# Patient Record
Sex: Female | Born: 2005
Health system: Southern US, Community
[De-identification: ages and names within clinical notes are randomized; demographics above are authoritative.]

## PROBLEM LIST (undated history)

## (undated) DIAGNOSIS — F419 Anxiety disorder, unspecified: Secondary | ICD-10-CM

## (undated) DIAGNOSIS — J45909 Unspecified asthma, uncomplicated: Secondary | ICD-10-CM

## (undated) DIAGNOSIS — T7840XA Allergy, unspecified, initial encounter: Secondary | ICD-10-CM

---
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
## 2023-01-01 ENCOUNTER — Other Ambulatory Visit: Payer: BC Managed Care – PPO

## 2023-01-09 ENCOUNTER — Encounter (HOSPITAL_COMMUNITY): Payer: Self-pay | Admitting: Psychiatry

## 2023-01-09 ENCOUNTER — Telehealth (HOSPITAL_BASED_OUTPATIENT_CLINIC_OR_DEPARTMENT_OTHER): Payer: BC Managed Care – PPO | Admitting: Psychiatry

## 2023-01-09 DIAGNOSIS — F411 Generalized anxiety disorder: Secondary | ICD-10-CM | POA: Diagnosis not present

## 2023-01-09 DIAGNOSIS — F325 Major depressive disorder, single episode, in full remission: Secondary | ICD-10-CM

## 2023-01-09 NOTE — Progress Notes (Signed)
BH MD/PA/NP OP Progress Note  01/09/2023 8:12 AM Briana Mccoy  MRN:  [id number]  Visit Diagnosis:    ICD-10-CM   1. GAD (generalized anxiety disorder)  F41.1     2. Major depressive disorder in remission, unspecified whether recurrent (HCC)  F32.5       Assessment: Briana Mccoy is a 17 y.o. female with a history of GAD and MDD who presented to [facility] for initial evaluation on 12/09/2022 after transfer from Dr. Milana Mccoy.    At initial evaluation patient reported symptoms of anxiety including excessive worry, ruminations, restlessness, increased irritability, negative thought patterns, skin/hair picking behaviors, and nausea.  Patient can experience increased physical symptoms when exposed to stressors including phone calls, phobias (bird/foods), or social interactions.  She also endorsed some degree of passive behaviors though denied associated compulsions.  In the past patient's anxiety could progress to depressive symptoms including low mood, anhedonia, amotivation, fatigue, and hypersomnia.  She denied any SI or thoughts of self-harm.  Since starting medications anxiety symptoms have become more tolerable and episodes of depression have become more intermittent and less severe.  Patient met criteria for generalized anxiety disorder with a strong social component as well as MDD in partial remission.  She also had some traits of OCD though not enough to meet criteria.   Briana Mccoy presents for follow-up evaluation. Today, 01/09/23, patient reports that her anxiety had increased towards the end school and has gone up again starting work.  Overall it has been manageable with the Lexapro.  Patient used the Atarax while in school but has not needed it since.  She has also had tried propranolol.  We will continue on her current regimen and follow-up in 4 to 6 weeks.  Plan: - Continue Lexapro 20 mg QD - Continue Atarax 10-30 mg QD prn for anxiety - Start  propranolol 5 mg BID prn for anxiety - Continue therapy with Briana Mccoy - Crisis resources reviewed - Follow up in 4-6 weeks    Chief Complaint:  Chief Complaint  Patient presents with   Follow-up   HPI: Briana Mccoy presents reporting that things are going well.  She finished school which was nice and then was on vacation up until a week and a half ago when she started and camp.  Patient reports that her anxiety was a little bit higher as she finished school but resolved completely while she was on vacation.  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  She notes that cannot because she has to be more outgoing and exuberant which is atypical for her since she is typically more introverted.  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  The staff physician allowed for more routine and structure which helps her anxiety.  Overall Briana Mccoy does feel that the need to be outgoing is a good thing though as is getting her prepare for college.  In regards to medication she continues to take Lexapro every day without adverse side effects.  While at school she was taking hydroxyzine at night to help with sleep.  She did not need the hydroxyzine after finishing school or once again to camp since she is emotionally and physically exhausted by the end of each day.  She has not tried propranolol yet due to some concerns about possible side effects in addition to not needing it while she was on medication.  She will consider trying this weekend if her anxiety is up.  Past Psychiatric History: Patient denies prior psychiatric hospitalizations or past suicide attempts. She connected with Dr. Milana Mccoy in 2021 for psychiatric care and followed with her until she retired. She sees Nurse, mental health for  therapy.   Patient has tried bupropion (dissociates), venlafaxine, fluoxetine (weight loss), and BuSpar.  Patient reports occasional marijuana use.  Risk of marijuana were discussed including adverse effects on anxiety.  She denies any other substance use.  Past Medical History:  Past Medical History:  Diagnosis Date   Allergy    Anxiety    Asthma     Past Surgical History:  Procedure Laterality Date   NASAL SEPTOPLASTY W/ TURBINOPLASTY Bilateral 02/25/2022   Procedure: NASAL SEPTOPLASTY WITH TURBINATE REDUCTION;  Surgeon: Briana Pies, MD;  Location: [facility];  Service: ENT;  Laterality: Bilateral;   WISDOM TOOTH EXTRACTION  2021    Family History: No family history on file.  Social History:  Social History   Socioeconomic History   Marital status: Single    Spouse name: Not on file   Number of children: Not on file   Years of education: Not on file   Highest education level: Not on file  Occupational History   Not on file  Tobacco Use   Smoking status: Never   Smokeless tobacco: Not on file  Vaping Use   Vaping Use: Former   Devices: last use july  Substance and Sexual Activity   Alcohol use: Never   Drug use: Never   Sexual activity: Not on file  Other Topics Concern   Not on file  Social History Narrative   Not on file   Social Determinants of Health   Financial Resource Strain: Not on file  Food Insecurity: Not on file  Transportation Needs: Not on file  Physical Activity: Not on file  Stress: Not on file  Social Connections: Not on file    Allergies:  Allergies  Allergen Reactions   Tree Extract Other (See Comments)    TREE NUTS    Current Medications: Current Outpatient Medications  Medication Sig Dispense Refill   cetirizine (ZYRTEC) 10 MG tablet Take 10 mg by mouth daily.     escitalopram (LEXAPRO) 20 MG tablet Take 1 tablet (20 mg total) by mouth daily. 90 tablet 1   FLOVENT HFA 44 MCG/ACT inhaler Inhale 2 puffs into the  lungs 2 (two) times daily.     hydrOXYzine (ATARAX) 10 MG tablet Take up to 3 tabs (30mg ) each day and 1 tab at bedtime as needed for anxiety 120 tablet 1   propranolol (INDERAL) 10 MG tablet Take 0.5 tablets (5 mg total) by mouth 2 (two) times daily as needed (anxiety). 60 tablet 1   No current facility-administered medications for this visit.     Psychiatric Specialty Exam: Review of Systems  There were no vitals taken for this visit.There is no height or weight on file to calculate BMI.  General Appearance: Fairly Groomed  Eye Contact:  Good  Speech:  Clear and Coherent and Normal Rate  Volume:  Normal  Mood:  Anxious and Euthymic  Affect:  Appropriate and Congruent  Thought Process:  Coherent  Orientation:  Full (Time, Place, and Person)  Thought Content: Logical   Suicidal Thoughts:  No  Homicidal Thoughts:  No  Memory:  Immediate;   Good  Judgement:  Good  Insight:  Good  Psychomotor Activity:  Normal  Concentration:  Concentration: Good  Recall:  Good  Fund of Knowledge: Good  Language: Good  Akathisia:  NA    AIMS (if indicated): not done  Assets:  Communication Skills Desire for Improvement Housing Intimacy Physical Health Resilience Social Support Talents/Skills Transportation Vocational/Educational  ADL's:  Intact  Cognition: WNL  Sleep:  Good   Metabolic Disorder Labs: No results found for: "HGBA1C", "MPG" No results found for: "PROLACTIN" No results found for: "CHOL", "TRIG", "HDL", "CHOLHDL", "VLDL", "LDLCALC" No results found for: "TSH"  Therapeutic Level Labs: No results found for: "LITHIUM" No results found for: "VALPROATE" No results found for: "CBMZ"   Screenings: GAD-7    Flowsheet Row Office Visit from 12/09/2022 [facility]  Total GAD-7 Score 16      PHQ2-9    Flowsheet Row Office Visit from 12/09/2022 in [facility] Counselor from 04/24/2022 in  [facility] Office Visit from 01/10/2021 in [facility] at [facility] Video Visit from 09/08/2020 in [facility] at [facility]  PHQ-2 Total Score 1 0 4 5  PHQ-9 Total Score -- -- 13 18      Flowsheet Row Office Visit from 12/09/2022 in [facility] Counselor from 04/24/2022 in [facility] at [facility] Admission (Discharged) from 02/25/2022 in [facility]  C-SSRS RISK CATEGORY No Risk No Risk No Risk       Collaboration of Care: Collaboration of Care: Medication Management AEB medication continuation  Patient/Guardian was advised Release of Information must be obtained prior to any record release in order to collaborate their care with an outside provider. Patient/Guardian was advised if they have not already done so to contact the registration department to sign all necessary forms in order for [place] to release information regarding their care.   Consent: Patient/Guardian gives verbal consent for treatment and assignment of benefits for services provided during this visit. Patient/Guardian expressed understanding and agreed to proceed.    Stasia Cavalier, MD 01/09/2023, 8:12 AM   Virtual Visit via Video Note  I connected with Briana Mccoy on 01/09/23 at  8:00 AM EDT by a video enabled telemedicine application and verified that I am speaking with the correct person using two identifiers.  Location: Patient: Home Provider: Home Office   I discussed the limitations of evaluation and management by telemedicine and the availability of in person appointments. The patient expressed understanding and agreed to proceed.   I discussed the assessment and treatment plan with the patient. The patient was provided an opportunity to ask questions and all were answered. The patient agreed with  the plan and demonstrated an understanding of the instructions.   The patient was advised to call back or seek an in-person evaluation if the symptoms worsen or if the condition fails to improve as anticipated.  30 minutes were spent in chart review, interview, psycho education, counseling, medical decision making, coordination of care and long-term prognosis.  Patient was given opportunity to ask question and all concerns and questions were addressed and answers. Excluding separately billable services. Stasia Cavalier, MD

## 2023-02-12 DIAGNOSIS — R112 Nausea with vomiting, unspecified: Secondary | ICD-10-CM | POA: Diagnosis not present

## 2023-02-12 DIAGNOSIS — Z20822 Contact with and (suspected) exposure to covid-19: Secondary | ICD-10-CM | POA: Diagnosis not present

## 2023-02-12 DIAGNOSIS — J029 Acute pharyngitis, unspecified: Secondary | ICD-10-CM | POA: Diagnosis not present

## 2023-02-12 DIAGNOSIS — J069 Acute upper respiratory infection, unspecified: Secondary | ICD-10-CM | POA: Diagnosis not present

## 2023-02-17 DIAGNOSIS — J45909 Unspecified asthma, uncomplicated: Secondary | ICD-10-CM | POA: Diagnosis not present

## 2023-02-17 DIAGNOSIS — R062 Wheezing: Secondary | ICD-10-CM | POA: Diagnosis not present

## 2023-02-19 NOTE — Progress Notes (Unsigned)
BH MD/PA/NP OP Progress Note  02/20/2023 8:26 AM Twylia Oka  MRN:  [id number]  Visit Diagnosis:    ICD-10-CM   1. GAD (generalized anxiety disorder)  F41.1     2. Major depressive disorder in remission, unspecified whether recurrent (HCC)  F32.5        Assessment: Ane Conerly is a 17 y.o. female with a history of GAD and MDD who presented to [facility] at [facility] for initial evaluation on 12/09/2022 after transfer from Dr. Milana Kidney.    At initial evaluation patient reported symptoms of anxiety including excessive worry, ruminations, restlessness, increased irritability, negative thought patterns, skin/hair picking behaviors, and nausea.  Patient can experience increased physical symptoms when exposed to stressors including phone calls, phobias (bird/foods), or social interactions.  She also endorsed some degree of passive behaviors though denied associated compulsions.  In the past patient's anxiety could progress to depressive symptoms including low mood, anhedonia, amotivation, fatigue, and hypersomnia.  She denied any SI or thoughts of self-harm.  Since starting medications anxiety symptoms have become more tolerable and episodes of depression have become more intermittent and less severe.  Patient met criteria for generalized anxiety disorder with a strong social component as well as MDD in partial remission.  She also had some traits of OCD though not enough to meet criteria.   Francena Hanly Olivares presents for follow-up evaluation. Today, 02/20/23, patient reports that her anxiety remains present at consistent mild level that is manageable.  In addition to this she has had a couple panic attacks over month.  Patient has not tried propranolol due to misplacing the medication but does take the Atarax with good success.  However is over sedating and she is interested in trying propranolol when checked about the refill from the pharmacy.  The panic attacks are secondary to  interpersonal stressors and feelings of being overwhelmed.  It was suggested that patient may connect with a therapist and she plans to reach out for another appointment.  We will follow-up in 6 to 8 weeks  Plan: - Continue Lexapro 20 mg QD - Continue Atarax 10-30 mg QD prn for anxiety - Continue propranolol 5 mg BID prn for anxiety - Continue therapy with [name], recommend scheduling another session - Crisis resources reviewed - Follow up in 6-8 weeks    Chief Complaint:  Chief Complaint  Patient presents with   Follow-up   HPI: Morning presents reporting the past month has been fine. She does not feel great currently though as she has been sick. She has still been working during this time to which has been a bit overwhelming.  Due to this she has been considering leaving camp for a week or early to rest at home before she starts college in August.  Patient plans to make her decision by the end of the day.  Over the past 6 weeks her anxiety has been fairly well-controlled.  She reports having some mild baseline anxiety in addition to 2 episodes of major anxiety twice the past month.  One time was when she was feeling more overwhelmed at work and the other had to do with stress about her relationship.  As her relationship is only around 4 to 45 months old the distance being several hours from each other and not having seen each other for a couple months has been very hard.  Tawsha notes that it feels like something that is his way and her down is worried that it will be the same  when she goes to college.  Patient as she is trying to discuss that with her boyfriend however he disagrees with her point of view.  We suggested that Neelah reconnect with a therapist to try and work through this which she agreed would be helpful.  Regards to medication she has taken Lexapro consistently and denies any adverse side effects.  Patient had wanted to take the propranolol for her panic attacks however  has misplaced the medication and been unable to find it.  Patient does note he plans to pick up a refill from the pharmacy instead.  While she has been using hydroxyzine with some benefit the medication makes her over sedated and she had difficulty doing anything the rest of the day.  She also did discuss her concern about the interaction with her asthma.  We did review this and patient notes that her asthma is not severe along really flaring up during times that she is sick.  Based off this and the limited frequency she is planning on using the propranolol we are not overly concerned about it negatively impacting her asthma symptoms.  She will monitor this however when she tries medication.  Past Psychiatric History: Patient denies prior psychiatric hospitalizations or past suicide attempts. She connected with Dr. Milana Kidney in 2021 for psychiatric care and followed with her until she retired. She sees Nurse, mental health for therapy.   Patient has tried bupropion (dissociates), venlafaxine, fluoxetine (weight loss), and BuSpar.  Patient reports occasional marijuana use.  Risk of marijuana were discussed including adverse effects on anxiety.  She denies any other substance use.  Past Medical History:  Past Medical History:  Diagnosis Date   Allergy    Anxiety    Asthma     Past Surgical History:  Procedure Laterality Date   NASAL SEPTOPLASTY W/ TURBINOPLASTY Bilateral 02/25/2022   Procedure: NASAL SEPTOPLASTY WITH TURBINATE REDUCTION;  Surgeon: Newman Pies, MD;  Location: [facility];  Service: ENT;  Laterality: Bilateral;   WISDOM TOOTH EXTRACTION  2021    Family History: No family history on file.  Social History:  Social History   Socioeconomic History   Marital status: Single    Spouse name: Not on file   Number of children: Not on file   Years of education: Not on file   Highest education level: Not on file  Occupational History   Not on file  Tobacco Use   Smoking status:  Never   Smokeless tobacco: Not on file  Vaping Use   Vaping status: Former   Devices: last use july  Substance and Sexual Activity   Alcohol use: Never   Drug use: Never   Sexual activity: Not on file  Other Topics Concern   Not on file  Social History Narrative   Not on file   Social Determinants of Health   Financial Resource Strain: Not on file  Food Insecurity: Not on file  Transportation Needs: Not on file  Physical Activity: Not on file  Stress: Not on file  Social Connections: Not on file    Allergies:  Allergies  Allergen Reactions   Tree Extract Other (See Comments)    TREE NUTS    Current Medications: Current Outpatient Medications  Medication Sig Dispense Refill   cetirizine (ZYRTEC) 10 MG tablet Take 10 mg by mouth daily.     escitalopram (LEXAPRO) 20 MG tablet Take 1 tablet (20 mg total) by mouth daily. 90 tablet 1   FLOVENT HFA 44 MCG/ACT inhaler  Inhale 2 puffs into the lungs 2 (two) times daily.     hydrOXYzine (ATARAX) 10 MG tablet Take up to 3 tabs (30mg ) each day and 1 tab at bedtime as needed for anxiety 120 tablet 1   propranolol (INDERAL) 10 MG tablet Take 0.5 tablets (5 mg total) by mouth 2 (two) times daily as needed (anxiety). 60 tablet 1   No current facility-administered medications for this visit.     Psychiatric Specialty Exam: Review of Systems  There were no vitals taken for this visit.There is no height or weight on file to calculate BMI.  General Appearance: Fairly Groomed  Eye Contact:  Good  Speech:  Clear and Coherent and Normal Rate  Volume:  Normal  Mood:  Anxious and tired  Affect:  Appropriate and Congruent  Thought Process:  Coherent  Orientation:  Full (Time, Place, and Person)  Thought Content: Logical   Suicidal Thoughts:  No  Homicidal Thoughts:  No  Memory:  Immediate;   Good  Judgement:  Fair  Insight:  Fair  Psychomotor Activity:  Normal  Concentration:  Concentration: Good  Recall:  Good  Fund of  Knowledge: Good  Language: Good  Akathisia:  NA    AIMS (if indicated): not done  Assets:  Communication Skills Desire for Improvement Housing Intimacy Physical Health Resilience Social Support Talents/Skills Transportation Vocational/Educational  ADL's:  Intact  Cognition: WNL  Sleep:  Good   Metabolic Disorder Labs: No results found for: "HGBA1C", "MPG" No results found for: "PROLACTIN" No results found for: "CHOL", "TRIG", "HDL", "CHOLHDL", "VLDL", "LDLCALC" No results found for: "TSH"  Therapeutic Level Labs: No results found for: "LITHIUM" No results found for: "VALPROATE" No results found for: "CBMZ"   Screenings: GAD-7    Flowsheet Row [facility]  Total GAD-7 Score 16      PHQ2-9    Flowsheet Row Office Visit from 12/09/2022 in [facility] Counselor from 04/24/2022 in [place] [facility] at [facility] Office Visit from 01/10/2021 in [facility] at [facility] Video Visit from 09/08/2020 in [facility] at [facility]  PHQ-2 Total Score 1 0 4 5  PHQ-9 Total Score -- -- 13 18      Flowsheet Row Office Visit from 12/09/2022 in [facility] Counselor from 04/24/2022 in [place] [facility] at [facility] Admission (Discharged) from 02/25/2022 in [facility]  C-SSRS RISK CATEGORY No Risk No Risk No Risk       Collaboration of Care: Collaboration of Care: Medication Management AEB medication continuation  Patient/Guardian was advised Release of Information must be obtained prior to any record release in order to collaborate their care with an outside provider. Patient/Guardian was advised if they have not already done so to contact the registration department to sign all necessary  forms in order for [place] to release information regarding their care.   Consent: Patient/Guardian gives verbal consent for treatment and assignment of benefits for services provided during this visit. Patient/Guardian expressed understanding and agreed to proceed.    Stasia Cavalier, MD 02/20/2023, 8:26 AM   Virtual Visit via Video Note  I connected with Bradd Canary on 02/20/23 at  8:00 AM EDT by a video enabled telemedicine application and verified that I am speaking with the correct person using two identifiers.  Location: Patient: Home Provider: Home Office   I discussed the limitations of  evaluation and management by telemedicine and the availability of in person appointments. The patient expressed understanding and agreed to proceed.   I discussed the assessment and treatment plan with the patient. The patient was provided an opportunity to ask questions and all were answered. The patient agreed with the plan and demonstrated an understanding of the instructions.   The patient was advised to call back or seek an in-person evaluation if the symptoms worsen or if the condition fails to improve as anticipated.  30 minutes were spent in chart review, interview, psycho education, counseling, medical decision making, coordination of care and long-term prognosis.  Patient was given opportunity to ask question and all concerns and questions were addressed and answers. Excluding separately billable services. Stasia Cavalier, MD

## 2023-02-20 ENCOUNTER — Telehealth (HOSPITAL_COMMUNITY): Payer: BC Managed Care – PPO | Admitting: Psychiatry

## 2023-02-20 ENCOUNTER — Encounter (HOSPITAL_COMMUNITY): Payer: Self-pay | Admitting: Psychiatry

## 2023-02-20 DIAGNOSIS — F411 Generalized anxiety disorder: Secondary | ICD-10-CM

## 2023-02-20 DIAGNOSIS — F325 Major depressive disorder, single episode, in full remission: Secondary | ICD-10-CM | POA: Diagnosis not present

## 2023-03-04 ENCOUNTER — Other Ambulatory Visit: Payer: Self-pay | Admitting: Obstetrics and Gynecology

## 2023-03-04 ENCOUNTER — Ambulatory Visit
Admission: RE | Admit: 2023-03-04 | Discharge: 2023-03-04 | Disposition: A | Discharge status: Home / Self Care | Payer: BC Managed Care – PPO | Source: Ambulatory Visit | Attending: Obstetrics and Gynecology | Admitting: Obstetrics and Gynecology

## 2023-03-04 DIAGNOSIS — N6315 Unspecified lump in the right breast, overlapping quadrants: Secondary | ICD-10-CM | POA: Diagnosis not present

## 2023-03-04 DIAGNOSIS — N6310 Unspecified lump in the right breast, unspecified quadrant: Secondary | ICD-10-CM

## 2023-03-04 DIAGNOSIS — N6313 Unspecified lump in the right breast, lower outer quadrant: Secondary | ICD-10-CM | POA: Diagnosis not present

## 2023-03-04 DIAGNOSIS — Z01419 Encounter for gynecological examination (general) (routine) without abnormal findings: Secondary | ICD-10-CM | POA: Diagnosis not present

## 2023-03-04 DIAGNOSIS — Z118 Encounter for screening for other infectious and parasitic diseases: Secondary | ICD-10-CM | POA: Diagnosis not present

## 2023-03-06 ENCOUNTER — Ambulatory Visit
Admission: RE | Admit: 2023-03-06 | Discharge: 2023-03-06 | Disposition: A | Discharge status: Home / Self Care | Payer: BC Managed Care – PPO | Source: Ambulatory Visit | Attending: Obstetrics and Gynecology | Admitting: Obstetrics and Gynecology

## 2023-03-06 DIAGNOSIS — N6310 Unspecified lump in the right breast, unspecified quadrant: Secondary | ICD-10-CM

## 2023-03-06 DIAGNOSIS — N6315 Unspecified lump in the right breast, overlapping quadrants: Secondary | ICD-10-CM | POA: Diagnosis not present

## 2023-03-06 DIAGNOSIS — D241 Benign neoplasm of right breast: Secondary | ICD-10-CM | POA: Diagnosis not present

## 2023-03-06 DIAGNOSIS — N6313 Unspecified lump in the right breast, lower outer quadrant: Secondary | ICD-10-CM | POA: Diagnosis not present

## 2023-03-06 HISTORY — PX: BREAST BIOPSY: SHX20

## 2023-04-06 IMAGING — DX DG WRIST COMPLETE 3+V*R*
4 series · 4 of 4 positions shown · non-contrast
Comparison: None Available.

CLINICAL DATA: Fall.

EXAM:
RIGHT WRIST - COMPLETE 3+ VIEW

[wrist ap]
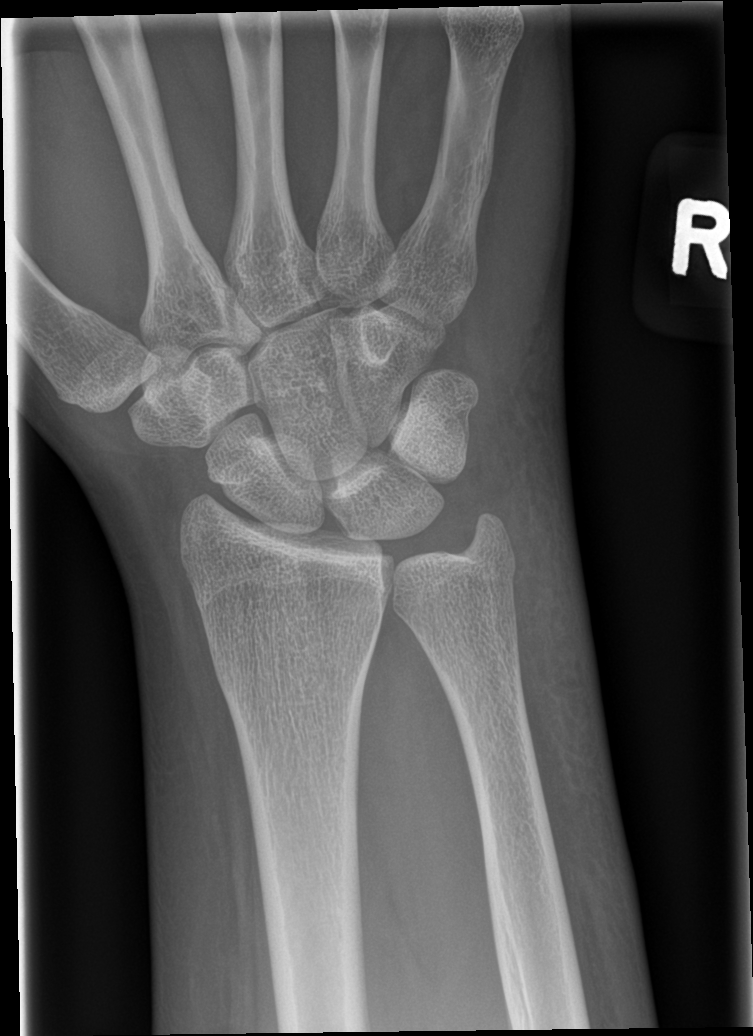

[wrist obl]
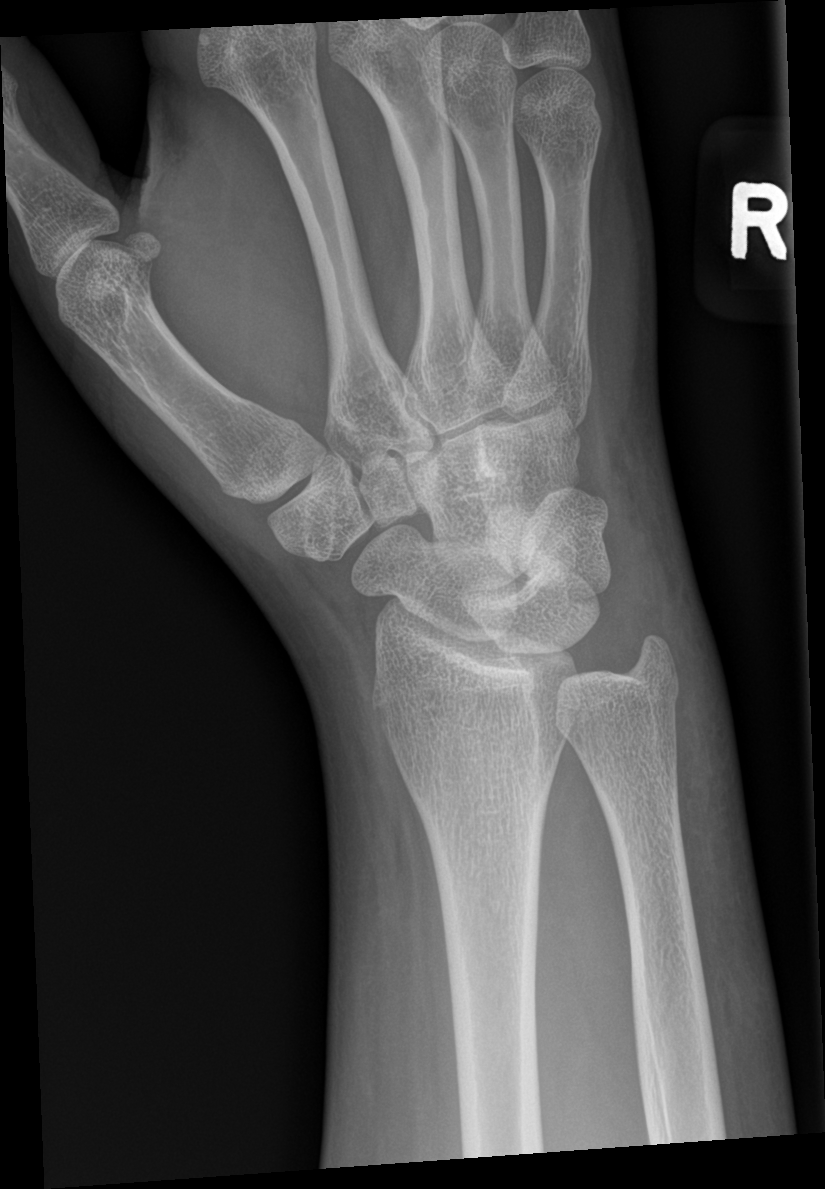

[wrist lat]
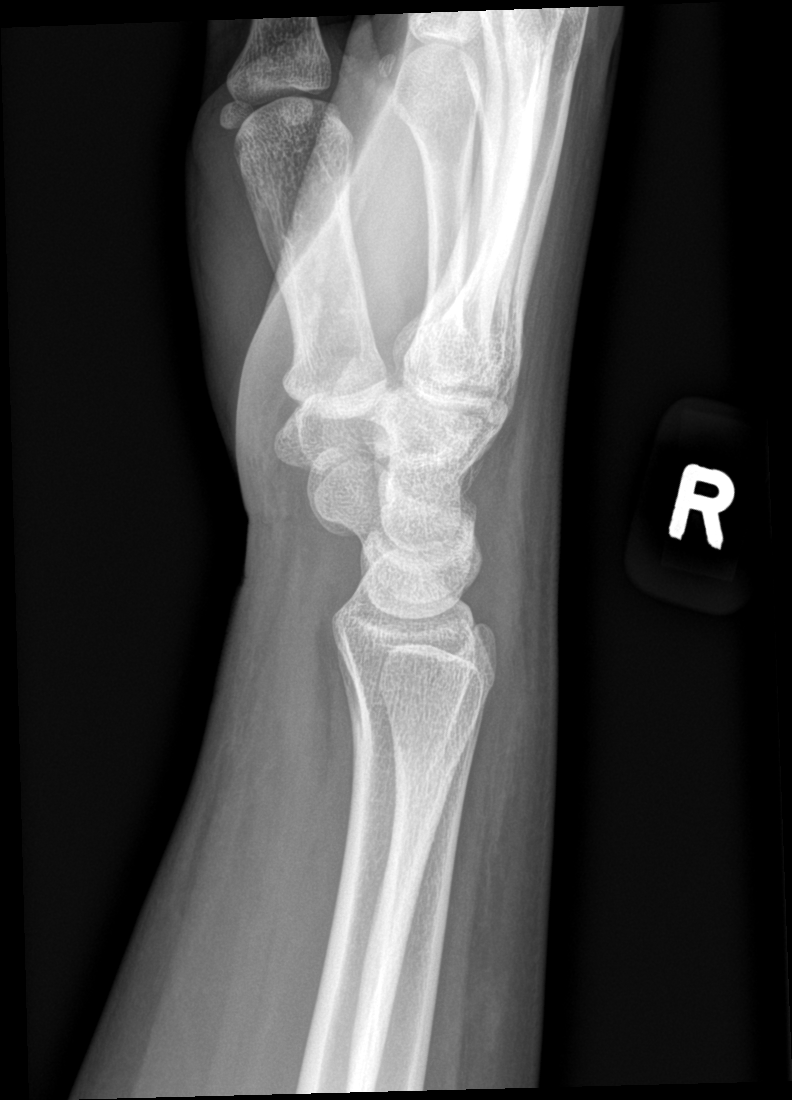

[wrist navicular]
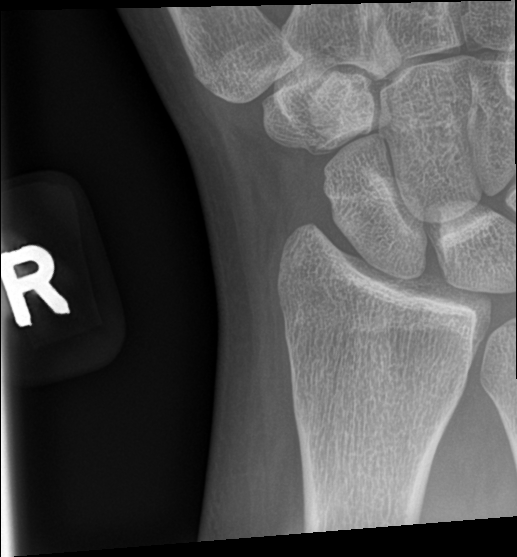

[4 of 4 positions shown; findings below may reference images not displayed]

FINDINGS: There is no evidence of fracture or dislocation. There is no
evidence of arthropathy or other focal bone abnormality. There is
medial soft tissue swelling.
IMPRESSION: No acute fracture or dislocation.

## 2023-04-14 NOTE — Progress Notes (Deleted)
BH MD/PA/NP OP Progress Note  04/14/2023 2:16 PM Briana Mccoy  MRN:  [id number]  Visit Diagnosis:  No diagnosis found.    Assessment: Briana Mccoy is a 17 y.o. female with a history of GAD and MDD who presented to [facility] at [facility] for initial evaluation on 12/09/2022 after transfer from Dr. Milana Mccoy.    At initial evaluation patient reported symptoms of anxiety including excessive worry, ruminations, restlessness, increased irritability, negative thought patterns, skin/hair picking behaviors, and nausea.  Patient can experience increased physical symptoms when exposed to stressors including phone calls, phobias (bird/foods), or social interactions.  She also endorsed some degree of passive behaviors though denied associated compulsions.  In the past patient's anxiety could progress to depressive symptoms including low mood, anhedonia, amotivation, fatigue, and hypersomnia.  She denied any SI or thoughts of self-harm.  Since starting medications anxiety symptoms have become more tolerable and episodes of depression have become more intermittent and less severe.  Patient met criteria for generalized anxiety disorder with a strong social component as well as MDD in partial remission.  She also had some traits of OCD though not enough to meet criteria.  Briana Mccoy presents for follow-up evaluation. Today, 04/14/23, patient reports that her   anxiety remains present at consistent mild level that is manageable.  In addition to this she has had a couple panic attacks over month.  Patient has not tried propranolol due to misplacing the medication but does take the Atarax with good success.  However is over sedating and she is interested in trying propranolol when checked about the refill from the pharmacy.  The panic attacks are secondary to interpersonal stressors and feelings of being overwhelmed.  It was suggested that patient may connect with a therapist and she plans  to reach out for another appointment.  We will follow-up in 6 to 8 weeks  Plan: - Continue Lexapro 20 mg QD - Continue Atarax 10-30 mg QD prn for anxiety - Continue propranolol 5 mg BID prn for anxiety - Continue therapy with Briana Mccoy, recommend scheduling another session - Crisis resources reviewed - Follow up in 6-8 weeks    Chief Complaint:  No chief complaint on file.  HPI: Briana Mccoy presents reporting that the  past month has been fine. She does not feel great currently though as she has been sick. She has still been working during this time to which has been a bit overwhelming.  Due to this she has been considering leaving camp for a week or early to rest at home before she starts college in August.  Patient plans to make her decision by the end of the day.  Over the past 6 weeks her anxiety has been fairly well-controlled.  She reports having some mild baseline anxiety in addition to 2 episodes of major anxiety twice the past month.  One time was when she was feeling more overwhelmed at work and the other had to do with stress about her relationship.  As her relationship is only around 4 to 67 months old the distance being several hours from each other and not having seen each other for a couple months has been very hard.  Briana Mccoy notes that it feels like something that is his way and her down is worried that it will be the same when she goes to college.  Patient as she is trying to discuss that with her boyfriend however he disagrees with her point of view.  We suggested that Briana Mccoy reconnect  with a therapist to try and work through this which she agreed would be helpful.  Regards to medication she has taken Lexapro consistently and denies any adverse side effects.  Patient had wanted to take the propranolol for her panic attacks however has misplaced the medication and been unable to find it.  Patient does note he plans to pick up a refill from the pharmacy instead.  While she has been  using hydroxyzine with some benefit the medication makes her over sedated and she had difficulty doing anything the rest of the day.  She also did discuss her concern about the interaction with her asthma.  We did review this and patient notes that her asthma is not severe along really flaring up during times that she is sick.  Based off this and the limited frequency she is planning on using the propranolol we are not overly concerned about it negatively impacting her asthma symptoms.  She will monitor this however when she tries medication.  Past Psychiatric History: Patient denies prior psychiatric hospitalizations or past suicide attempts. She connected with Dr. Milana Mccoy in 2021 for psychiatric care and followed with her until she retired. She sees Nurse, mental health for therapy.   Patient has tried bupropion (dissociates), venlafaxine, fluoxetine (weight loss), and BuSpar.  Patient reports occasional marijuana use.  Risk of marijuana were discussed including adverse effects on anxiety.  She denies any other substance use.  Past Medical History:  Past Medical History:  Diagnosis Date   Allergy    Anxiety    Asthma     Past Surgical History:  Procedure Laterality Date   BREAST BIOPSY Right 03/06/2023   [place] RT BREAST BX W LOC DEV EA ADD LESION IMG BX SPEC US GUIDE 03/06/2023 [facility]   BREAST BIOPSY Right 03/06/2023   [place] RT BREAST BX W LOC DEV 1ST LESION IMG BX SPEC US GUIDE 03/06/2023 [facility]   NASAL SEPTOPLASTY W/ TURBINOPLASTY Bilateral 02/25/2022   Procedure: NASAL SEPTOPLASTY WITH TURBINATE REDUCTION;  Surgeon: Briana Pies, MD;  Location: [facility];  Service: ENT;  Laterality: Bilateral;   WISDOM TOOTH EXTRACTION  2021    Family History: No family history on file.  Social History:  Social History   Socioeconomic History   Marital status: Single    Spouse name: Not on file   Number of children: Not on file   Years of education: Not on file   Highest  education level: Not on file  Occupational History   Not on file  Tobacco Use   Smoking status: Never   Smokeless tobacco: Not on file  Vaping Use   Vaping status: Former   Devices: last use july  Substance and Sexual Activity   Alcohol use: Never   Drug use: Never   Sexual activity: Not on file  Other Topics Concern   Not on file  Social History Narrative   Not on file   Social Determinants of Health   Financial Resource Strain: Not on file  Food Insecurity: Not on file  Transportation Needs: Not on file  Physical Activity: Not on file  Stress: Not on file  Social Connections: Not on file    Allergies:  Allergies  Allergen Reactions   Tree Extract Other (See Comments)    TREE NUTS    Current Medications: Current Outpatient Medications  Medication Sig Dispense Refill   cetirizine (ZYRTEC) 10 MG tablet Take 10 mg by mouth daily.     escitalopram (LEXAPRO) 20 MG  tablet Take 1 tablet (20 mg total) by mouth daily. 90 tablet 1   FLOVENT HFA 44 MCG/ACT inhaler Inhale 2 puffs into the lungs 2 (two) times daily.     hydrOXYzine (ATARAX) 10 MG tablet Take up to 3 tabs (30mg ) each day and 1 tab at bedtime as needed for anxiety 120 tablet 1   propranolol (INDERAL) 10 MG tablet Take 0.5 tablets (5 mg total) by mouth 2 (two) times daily as needed (anxiety). 60 tablet 1   No current facility-administered medications for this visit.     Psychiatric Specialty Exam: Review of Systems  There were no vitals taken for this visit.There is no height or weight on file to calculate BMI.  General Appearance: Fairly Groomed  Eye Contact:  Good  Speech:  Clear and Coherent and Normal Rate  Volume:  Normal  Mood:  Anxious and tired  Affect:  Appropriate and Congruent  Thought Process:  Coherent  Orientation:  Full (Time, Place, and Person)  Thought Content: Logical   Suicidal Thoughts:  No  Homicidal Thoughts:  No  Memory:  Immediate;   Good  Judgement:  Fair  Insight:  Fair   Psychomotor Activity:  Normal  Concentration:  Concentration: Good  Recall:  Good  Fund of Knowledge: Good  Language: Good  Akathisia:  NA    AIMS (if indicated): not done  Assets:  Communication Skills Desire for Improvement Housing Intimacy Physical Health Resilience Social Support Talents/Skills Transportation Vocational/Educational  ADL's:  Intact  Cognition: WNL  Sleep:  Good   Metabolic Disorder Labs: No results found for: "HGBA1C", "MPG" No results found for: "PROLACTIN" No results found for: "CHOL", "TRIG", "HDL", "CHOLHDL", "VLDL", "LDLCALC" No results found for: "TSH"  Therapeutic Level Labs: No results found for: "LITHIUM" No results found for: "VALPROATE" No results found for: "CBMZ"   Screenings: GAD-7    Flowsheet Row [facility]/20[facility]  Total GAD-7 Score 16      PHQ2-9    Flowsheet Row Office Visit from 12/09/2022 in [facility] Counselor from 04/24/2022 in [facility] at [facility] Office Visit from 01/10/2021 in [facility] at [facility] Video Visit from 09/08/2020 in [facility] at [facility]  PHQ-2 Total Score 1 0 4 5  PHQ-9 Total Score -- -- 13 18      Flowsheet Row Office Visit from 12/09/2022 in [facility] Counselor from 04/24/2022 in [facility] at [facility] Admission (Discharged) from 02/25/2022 in [facility]  C-SSRS RISK CATEGORY No Risk No Risk No Risk       Collaboration of Care: Collaboration of Care: Medication Management AEB medication continuation  Patient/Guardian was advised Release of Information must be obtained prior to any record release in order to collaborate their care with an outside provider. Patient/Guardian was  advised if they have not already done so to contact the registration department to sign all necessary forms in order for [place] to release information regarding their care.   Consent: Patient/Guardian gives verbal consent for treatment and assignment of benefits for services provided during this visit. Patient/Guardian expressed understanding and agreed to proceed.    Stasia Cavalier, MD 04/14/2023, 2:16 PM   Virtual Visit via Video Note  I connected with Briana Mccoy on 04/14/23 at  8:00 AM EDT by a video enabled telemedicine application and verified that I am speaking with  the correct person using two identifiers.  Location: Patient: Home Provider: Home Office   I discussed the limitations of evaluation and management by telemedicine and the availability of in person appointments. The patient expressed understanding and agreed to proceed.   I discussed the assessment and treatment plan with the patient. The patient was provided an opportunity to ask questions and all were answered. The patient agreed with the plan and demonstrated an understanding of the instructions.   The patient was advised to call back or seek an in-person evaluation if the symptoms worsen or if the condition fails to improve as anticipated.  30 minutes were spent in chart review, interview, psycho education, counseling, medical decision making, coordination of care and long-term prognosis.  Patient was given opportunity to ask question and all concerns and questions were addressed and answers. Excluding separately billable services. Stasia Cavalier, MD

## 2023-04-18 ENCOUNTER — Encounter (HOSPITAL_COMMUNITY): Payer: Self-pay

## 2023-04-18 ENCOUNTER — Encounter (HOSPITAL_COMMUNITY): Payer: BC Managed Care – PPO | Admitting: Psychiatry

## 2023-04-18 NOTE — Progress Notes (Signed)
Patient did not show up for the appointment. Attempted to call the patient but received no response. Left a voicemail for patient to reschedule.   This encounter was created in error - please disregard.

## 2023-04-24 ENCOUNTER — Telehealth (HOSPITAL_COMMUNITY): Payer: Self-pay | Admitting: Licensed Clinical Social Worker

## 2023-04-24 NOTE — Telephone Encounter (Signed)
Patient is now in college in [place]. Patient would like to continue services with you, however will need virtual.   Will you be ok with that?  She will come in person during her breaks if she is able.   Best call back number. [phone]

## 2023-05-28 DIAGNOSIS — Z23 Encounter for immunization: Secondary | ICD-10-CM | POA: Diagnosis not present

## 2023-05-28 DIAGNOSIS — J029 Acute pharyngitis, unspecified: Secondary | ICD-10-CM | POA: Diagnosis not present

## 2023-06-30 ENCOUNTER — Telehealth (HOSPITAL_COMMUNITY): Payer: Self-pay

## 2023-06-30 DIAGNOSIS — F411 Generalized anxiety disorder: Secondary | ICD-10-CM

## 2023-06-30 MED ORDER — HYDROXYZINE HCL 10 MG PO TABS
ORAL_TABLET | ORAL | 0 refills | Status: DC
Start: 2023-06-30 — End: 2023-11-27

## 2023-06-30 MED ORDER — PROPRANOLOL HCL 10 MG PO TABS
5.0000 mg | ORAL_TABLET | Freq: Two times a day (BID) | ORAL | 0 refills | Status: DC | PRN
Start: 2023-06-30 — End: 2023-08-28

## 2023-06-30 MED ORDER — ESCITALOPRAM OXALATE 20 MG PO TABS
20.0000 mg | ORAL_TABLET | Freq: Every day | ORAL | 0 refills | Status: DC
Start: 2023-06-30 — End: 2023-08-28

## 2023-06-30 NOTE — Telephone Encounter (Signed)
Patient missed her appointment in September, she has an appointment for January and wants to know if she can get refills until then. Please review and advise, thank you

## 2023-06-30 NOTE — Telephone Encounter (Signed)
Refills were sent in to [facility]

## 2023-07-29 DIAGNOSIS — S42294A Other nondisplaced fracture of upper end of right humerus, initial encounter for closed fracture: Secondary | ICD-10-CM | POA: Diagnosis not present

## 2023-07-29 DIAGNOSIS — Y9239 Other specified sports and athletic area as the place of occurrence of the external cause: Secondary | ICD-10-CM | POA: Diagnosis not present

## 2023-08-01 ENCOUNTER — Ambulatory Visit (INDEPENDENT_AMBULATORY_CARE_PROVIDER_SITE_OTHER): Payer: BC Managed Care – PPO | Admitting: Physician Assistant

## 2023-08-01 ENCOUNTER — Encounter: Payer: Self-pay | Admitting: Physician Assistant

## 2023-08-01 DIAGNOSIS — M25511 Pain in right shoulder: Secondary | ICD-10-CM | POA: Diagnosis not present

## 2023-08-01 MED ORDER — MELOXICAM 15 MG PO TABS: 15.0000 mg | ORAL_TABLET | Freq: Every day | ORAL | 0 refills | Status: AC

## 2023-08-01 NOTE — Addendum Note (Signed)
 Addended by: Polly Cobia on: 08/01/2023 09:23 AM   Modules accepted: Orders

## 2023-08-01 NOTE — Progress Notes (Signed)
 Office Visit Note   Patient: Briana Mccoy           Date of Birth: 2006/07/24           MRN: [id number] Visit Date: 08/01/2023              Requested by: Madalyn Nest, MD [id number] [place],  [place] [address] PCP: Madalyn Nest, MD   Assessment & Plan: Visit Diagnoses:  1. 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  She is right-hand dominant.  She was seen and evaluated emergency room x-rays were taken demonstrating [facility] separation.  Possibly small chip avulsion.  She is very painful difficult to examine today.  She has no ecchymosis she is able to actively abduct her arm.  She has good grip strength pulses are intact.  I recommend immobilization continue in the sling she may came out to stretch her arm and do gentle pendulum exercises.  Would need to be evaluated in 2 weeks.  She is going back to [place] to go to school I recommended evaluation by practitioner up there to make sure she is progressing in the right direction.  Should take Mobic  which I will call in for her today cannot take ibuprofen in addition to this  Follow-Up Instructions: 2 weeks  Orders:  No orders of the defined types were placed in this encounter.  No orders of the defined types were placed in this encounter.     Procedures: No procedures performed   Clinical Data: No additional findings.   Subjective: No chief complaint on file.   HPI Right-hand-dominant 18 year old college student was skiing in [place]  3 days ago and fell onto her right shoulder denies any dislocation.  Denies any paresthesias.  Was seen and evaluated at a local emergency room there x-rays were taken demonstrating an [facility] separation.  He was given a sling told to follow-up Review of Systems  All other systems reviewed and are negative.   Objective: Vital Signs: There were no vitals taken for this visit.  Physical  Exam Constitutional:      Appearance: Normal appearance.  Skin:    General: Skin is warm and dry.  Neurological:     General: No focal deficit present.     Mental Status: She is alert and oriented to person, place, and time.  Psychiatric:        Mood and Affect: Mood normal.        Behavior: Behavior normal.   Ortho Exam Right arm she has no ecchymosis she has no swelling distal pulses are intact she has good grip strength she has good flexion extension of her elbow.  She is tender over the [employer] joint minimally tender over the humeral head.  Has good active abduction.  Is resistant to other range of motion secondary to pain.  No tenderness to palpation of the humerus Specialty Comments:  No specialty comments available.  Imaging: No results found.   PMFS History: Patient Active Problem List   Diagnosis Date Noted   Pain in right shoulder 08/01/2023   Sebaceous cyst 04/17/2021   Changing pigmented skin lesion 04/17/2021   Past Medical History:  Diagnosis Date   Allergy    Anxiety    Asthma     No family history on file.  Past Surgical History:  Procedure Laterality Date   BREAST BIOPSY Right 03/06/2023  US  RT BREAST BX W LOC DEV EA ADD LESION IMG BX SPEC US  GUIDE 03/06/2023 [facility]   BREAST BIOPSY Right 03/06/2023   US  RT BREAST BX W LOC DEV 1ST LESION IMG BX SPEC US  GUIDE 03/06/2023 [facility]   NASAL SEPTOPLASTY W/ TURBINOPLASTY Bilateral 02/25/2022   Procedure: NASAL SEPTOPLASTY WITH TURBINATE REDUCTION;  Surgeon: Karis Clunes, MD;  Location: [facility];  Service: ENT;  Laterality: Bilateral;   WISDOM TOOTH EXTRACTION  2021   Social History   Occupational History   Not on file  Tobacco Use   Smoking status: Never   Smokeless tobacco: Not on file  Vaping Use   Vaping status: Former   Devices: last use july  Substance and Sexual Activity   Alcohol use: Never   Drug use: Never   Sexual activity: Not on file

## 2023-08-07 DIAGNOSIS — Z30430 Encounter for insertion of intrauterine contraceptive device: Secondary | ICD-10-CM | POA: Diagnosis not present

## 2023-08-07 DIAGNOSIS — J3081 Allergic rhinitis due to animal (cat) (dog) hair and dander: Secondary | ICD-10-CM | POA: Diagnosis not present

## 2023-08-07 DIAGNOSIS — Z3202 Encounter for pregnancy test, result negative: Secondary | ICD-10-CM | POA: Diagnosis not present

## 2023-08-07 DIAGNOSIS — J4530 Mild persistent asthma, uncomplicated: Secondary | ICD-10-CM | POA: Diagnosis not present

## 2023-08-07 DIAGNOSIS — J3089 Other allergic rhinitis: Secondary | ICD-10-CM | POA: Diagnosis not present

## 2023-08-07 DIAGNOSIS — Z309 Encounter for contraceptive management, unspecified: Secondary | ICD-10-CM | POA: Diagnosis not present

## 2023-08-07 DIAGNOSIS — J301 Allergic rhinitis due to pollen: Secondary | ICD-10-CM | POA: Diagnosis not present

## 2023-08-19 DIAGNOSIS — S4351XA Sprain of right acromioclavicular joint, initial encounter: Secondary | ICD-10-CM | POA: Diagnosis not present

## 2023-08-28 ENCOUNTER — Telehealth (HOSPITAL_COMMUNITY): Payer: BC Managed Care – PPO | Admitting: Psychiatry

## 2023-08-28 ENCOUNTER — Telehealth (HOSPITAL_COMMUNITY): Payer: Self-pay | Admitting: Psychiatry

## 2023-08-28 ENCOUNTER — Encounter (HOSPITAL_COMMUNITY): Payer: Self-pay | Admitting: Psychiatry

## 2023-08-28 DIAGNOSIS — F325 Major depressive disorder, single episode, in full remission: Secondary | ICD-10-CM | POA: Diagnosis not present

## 2023-08-28 DIAGNOSIS — F411 Generalized anxiety disorder: Secondary | ICD-10-CM | POA: Diagnosis not present

## 2023-08-28 MED ORDER — PROPRANOLOL HCL 10 MG PO TABS
5.0000 mg | ORAL_TABLET | Freq: Two times a day (BID) | ORAL | 0 refills | Status: DC | PRN
Start: 2023-08-28 — End: 2023-11-27

## 2023-08-28 MED ORDER — ESCITALOPRAM OXALATE 20 MG PO TABS
20.0000 mg | ORAL_TABLET | Freq: Every day | ORAL | 0 refills | Status: DC
Start: 2023-08-28 — End: 2023-11-25
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
## 2023-09-01 DIAGNOSIS — S93492A Sprain of other ligament of left ankle, initial encounter: Secondary | ICD-10-CM | POA: Diagnosis not present

## 2023-09-05 DIAGNOSIS — Z30431 Encounter for routine checking of intrauterine contraceptive device: Secondary | ICD-10-CM | POA: Diagnosis not present

## 2023-09-13 DIAGNOSIS — T6594XA Toxic effect of unspecified substance, undetermined, initial encounter: Secondary | ICD-10-CM | POA: Diagnosis not present

## 2023-09-13 DIAGNOSIS — S0592XA Unspecified injury of left eye and orbit, initial encounter: Secondary | ICD-10-CM | POA: Diagnosis not present

## 2023-09-13 DIAGNOSIS — T2692XA Corrosion of left eye and adnexa, part unspecified, initial encounter: Secondary | ICD-10-CM | POA: Diagnosis not present

## 2023-09-13 DIAGNOSIS — X58XXXA Exposure to other specified factors, initial encounter: Secondary | ICD-10-CM | POA: Diagnosis not present

## 2023-09-13 DIAGNOSIS — H538 Other visual disturbances: Secondary | ICD-10-CM | POA: Diagnosis not present

## 2023-09-13 DIAGNOSIS — H5712 Ocular pain, left eye: Secondary | ICD-10-CM | POA: Diagnosis not present
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
## 2023-10-23 ENCOUNTER — Telehealth (HOSPITAL_COMMUNITY): Payer: BC Managed Care – PPO | Admitting: Psychiatry

## 2023-10-23 ENCOUNTER — Encounter (HOSPITAL_COMMUNITY): Payer: Self-pay

## 2023-10-24 DIAGNOSIS — S4351XD Sprain of right acromioclavicular joint, subsequent encounter: Secondary | ICD-10-CM | POA: Diagnosis not present

## 2023-11-22 ENCOUNTER — Other Ambulatory Visit (HOSPITAL_COMMUNITY): Payer: Self-pay | Admitting: Psychiatry

## 2023-11-22 DIAGNOSIS — F411 Generalized anxiety disorder: Secondary | ICD-10-CM

## 2023-11-25 ENCOUNTER — Other Ambulatory Visit (HOSPITAL_COMMUNITY): Payer: Self-pay

## 2023-11-25 DIAGNOSIS — F411 Generalized anxiety disorder: Secondary | ICD-10-CM

## 2023-11-25 MED ORDER — ESCITALOPRAM OXALATE 20 MG PO TABS: 20.0000 mg | ORAL_TABLET | Freq: Every day | ORAL | 0 refills | Status: DC

## 2023-11-26 NOTE — Progress Notes (Signed)
 BH MD/PA/NP OP Progress Note  11/27/2023 3:27 PM Briana Mccoy  MRN:  [id number]  Visit Diagnosis:    ICD-10-CM   1. GAD (generalized anxiety disorder)  F41.1 escitalopram  (LEXAPRO ) 20 MG tablet    ARIPiprazole  (ABILIFY ) 2 MG tablet    propranolol  (INDERAL ) 10 MG tablet    2. Major depressive disorder in remission, unspecified whether recurrent (HCC)  F32.5 escitalopram  (LEXAPRO ) 20 MG tablet    ARIPiprazole  (ABILIFY ) 2 MG tablet      Assessment: Briana Mccoy is a 18 y.o. female with a history of GAD and MDD who presented to [facility] at [facility] for initial evaluation on 12/09/2022 after transfer from Dr. Luvenia Salvage.    At initial evaluation patient reported symptoms of anxiety including excessive worry, ruminations, restlessness, increased irritability, negative thought patterns, skin/hair picking behaviors, and nausea.  Patient can experience increased physical symptoms when exposed to stressors including phone calls, phobias (bird/foods), or social interactions.  She also endorsed some degree of passive behaviors though denied associated compulsions.  In the past patient's anxiety could progress to depressive symptoms including low mood, anhedonia, amotivation, fatigue, and hypersomnia.  She denied any SI or thoughts of self-harm.  Since starting medications anxiety symptoms have become more tolerable and episodes of depression have become more intermittent and less severe.  Patient met criteria for generalized anxiety disorder with a strong social component as well as MDD in partial remission.  She also had some traits of OCD though not enough to meet criteria.  Briana Mccoy presents for follow-up evaluation. Today, 11/27/23, patient reports significant increase in anxiety in the interim.  She reports excessive worry, some increased difficulty with sleep, feelings of being overwhelmed, restlessness, palpitations, and chest pressure.  Notably symptoms are constant  throughout the day.  Patient continues to take Lexapro  consistently and believes was helping for the depression but less so for the anxiety.  She has not tried the propranolol  due to the anxiety being persistent and as she dislikes as needed medication.  Had discussed the possibility of trying a long-acting propranolol  however her would need patient to take the immediate release a couple times first to ensure no adverse side effects.  Instead would be appropriate to start low-dose Abilify  for adjunct in anxiety and mood symptoms.  Risks and benefits of this were explained.  We will follow up in a month.  Patient has also connected with a therapist at school it has been beneficial.  Psychotherapeutic interventions were used during today's session. From 3:02-3:18 PM. Therapeutic interventions included empathic listening, supportive therapy, cognitive and behavioral therapy, motivational interviewing. Used supportive interviewing techniques to provide emotional validation. Worked on cognitive reframing techniques and recommendations made for behavioral activation.  Improvement was evidenced by patient's participation and identified commitment to therapy goals  Plan: - Continue Lexapro  20 mg every day - Start Abilify  2 mg daily  -We will need to get updated metabolic blood work - Continue propranolol  5 mg BID prn for anxiety (if patient tries we could consider changing to long-acting formulary in the future) - Discontinue Atarax  (oversedation) - Continue therapy with school therapist - Crisis resources reviewed - Follow up in a month   Chief Complaint:  Chief Complaint  Patient presents with   Follow-up   HPI: Briana Mccoy presents reporting that she is struggling with an increase in anxiety symptoms.  She describes feeling overwhelmed and anxious all the time.  Up until a few days ago, when patient took a leave from work, she  reports that her days were spent entirely between school/schoolwork and work.   She had no time to take for herself or focus on self-care.  Patient did ask with a therapist a few weeks ago and at the recommendation took a leave from work in the last few days.  She notes that her anxiety symptoms have been increasing as she approaches finals I will reschedule for next Thursday and she is hopeful that the anxiety symptoms will improve after that.  Patient will be standing at [facility] area of the summer where she can continue to work and take 1 asynchronous summer course.  Briana Mccoy does feel like the depression is well controlled on Lexapro  though is not feeling that way about the anxiety.  In regards to medication still is taking the Lexapro  consistently but has not tried the as needed propranolol .  She reports that this is due to disliking taking as needed medications and because she is anxious all the time.  As for the hydroxyzine  she discontinued it because it was making her over sedated the next day.  Given this we recommended starting the patient on a daily medication to better manage anxiety symptoms.  Had considered BuSpar  however patient reports adverse side effects on it in the past.  Since ruminations are such a major concern we suggested starting low-dose Abilify  as an adjunct for mood and anxiety symptoms.  We had also discussed the possibility of a long-acting propranolol  we would need patient to take the immediate release version first to confirm there is no adverse side effects.  Given that there has been hesitancy to take the as needed medication Abilify  was determined to be the best option at this time.  Past Psychiatric History: Patient denies prior psychiatric hospitalizations or past suicide attempts. She connected with Dr. Luvenia Salvage in 2021 for psychiatric care and followed with her until she retired. She sees Nurse, mental health for therapy.   Patient has tried bupropion  (dissociates), venlafaxine , fluoxetine  (weight loss), and BuSpar .  Patient reports occasional marijuana  use.  Risk of marijuana were discussed including adverse effects on anxiety.  She denies any other substance use.   Past Medical History:  Past Medical History:  Diagnosis Date   Allergy    Anxiety    Asthma     Past Surgical History:  Procedure Laterality Date   BREAST BIOPSY Right 03/06/2023   US  RT BREAST BX W LOC DEV EA ADD LESION IMG BX SPEC US  GUIDE 03/06/2023 [facility]   BREAST BIOPSY Right 03/06/2023   US  RT BREAST BX W LOC DEV 1ST LESION IMG BX SPEC US  GUIDE 03/06/2023 [facility]   NASAL SEPTOPLASTY W/ TURBINOPLASTY Bilateral 02/25/2022   Procedure: NASAL SEPTOPLASTY WITH TURBINATE REDUCTION;  Surgeon: Reynold Caves, MD;  Location: [facility];  Service: ENT;  Laterality: Bilateral;   WISDOM TOOTH EXTRACTION  2021   Family History: History reviewed. No pertinent family history.  Social History:  Social History   Socioeconomic History   Marital status: Single    Spouse name: Not on file   Number of children: Not on file   Years of education: Not on file   Highest education level: Not on file  Occupational History   Not on file  Tobacco Use   Smoking status: Never   Smokeless tobacco: Not on file  Vaping Use   Vaping status: Former   Devices: last use july  Substance and Sexual Activity   Alcohol use: Never   Drug use: Never  Sexual activity: Not on file  Other Topics Concern   Not on file  Social History Narrative   Not on file   Social Drivers of Health   Financial Resource Strain: Not on file  Food Insecurity: Not on file  Transportation Needs: Not on file  Physical Activity: Not on file  Stress: Not on file  Social Connections: Not on file    Allergies:  Allergies  Allergen Reactions   Tree Extract Other (See Comments)    TREE NUTS    Current Medications: Current Outpatient Medications  Medication Sig Dispense Refill   ARIPiprazole  (ABILIFY ) 2 MG tablet Take 1 tablet (2 mg total) by mouth daily. 90 tablet 0   cetirizine  (ZYRTEC) 10 MG tablet Take 10 mg by mouth daily.     escitalopram  (LEXAPRO ) 20 MG tablet Take 1 tablet (20 mg total) by mouth daily. 90 tablet 0   FLOVENT HFA 44 MCG/ACT inhaler Inhale 2 puffs into the lungs 2 (two) times daily.     meloxicam  (MOBIC ) 15 MG tablet Take 1 tablet (15 mg total) by mouth daily. 30 tablet 0   propranolol  (INDERAL ) 10 MG tablet Take 0.5 tablets (5 mg total) by mouth 2 (two) times daily as needed (anxiety). 60 tablet 0   No current facility-administered medications for this visit.     Psychiatric Specialty Exam: Review of Systems  There were no vitals taken for this visit.There is no height or weight on file to calculate BMI.  General Appearance: Fairly Groomed  Eye Contact:  Good  Speech:  Clear and Coherent  Volume:  Normal  Mood:  Anxious and Depressed  Affect:  Congruent  Thought Process:  Coherent  Orientation:  Full (Time, Place, and Person)  Thought Content: Logical   Suicidal Thoughts:  No  Homicidal Thoughts:  No  Memory:  Immediate;   Good  Judgement:  Fair  Insight:  Fair  Psychomotor Activity:  Normal  Concentration:  Concentration: Good  Recall:  Good  Fund of Knowledge: Fair  Language: Good  Akathisia:  NA    AIMS (if indicated): not done  Assets:  Communication Skills Desire for Improvement Housing Talents/Skills Transportation Vocational/Educational  ADL's:  Intact  Cognition: WNL  Sleep:  Good   Metabolic Disorder Labs: No results found for: "HGBA1C", "MPG" No results found for: "PROLACTIN" No results found for: "CHOL", "TRIG", "HDL", "CHOLHDL", "VLDL", "LDLCALC" No results found for: "TSH"  Therapeutic Level Labs: No results found for: "LITHIUM" No results found for: "VALPROATE" No results found for: "CBMZ"   Screenings: GAD-7    Flowsheet Row [facility]  Total GAD-7 Score 16      PHQ2-9    Flowsheet Row Office Visit from 12/09/2022 in  [facility] Counselor from 04/24/2022 in [facility] at [facility] Office Visit from 01/10/2021 in [facility] at [facility] Video Visit from 09/08/2020 in [facility] at [facility]  PHQ-2 Total Score 1 0 4 5  PHQ-9 Total Score -- -- 13 18      Flowsheet Row Office Visit from 12/09/2022 in [facility] Counselor from 04/24/2022 in [facility] at [facility] Admission (Discharged) from 02/25/2022 in [facility]  C-SSRS RISK CATEGORY No Risk No Risk No Risk       Collaboration of Care: Collaboration of Care: Medication Management AEB medication prescription and Other provider  involved in patient's care AEB OB and urgent care chart review  Patient/Guardian was advised Release of Information must be obtained prior to any record release in order to collaborate their care with an outside provider. Patient/Guardian was advised if they have not already done so to contact the registration department to sign all necessary forms in order for us  to release information regarding their care.   Consent: Patient/Guardian gives verbal consent for treatment and assignment of benefits for services provided during this visit. Patient/Guardian expressed understanding and agreed to proceed.    Yves Herb, MD 11/27/2023, 3:27 PM   Virtual Visit via Video Note  I connected with Vernestine Gondola on 11/27/23 at  3:00 PM EDT by a video enabled telemedicine application and verified that I am speaking with the correct person using two identifiers.  Location: Patient: School Provider: Home Office   I discussed the limitations of evaluation and management by telemedicine and the availability of in person appointments. The patient expressed understanding and agreed to proceed.   I  discussed the assessment and treatment plan with the patient. The patient was provided an opportunity to ask questions and all were answered. The patient agreed with the plan and demonstrated an understanding of the instructions.   The patient was advised to call back or seek an in-person evaluation if the symptoms worsen or if the condition fails to improve as anticipated.  I provided 20 minutes of non-face-to-face time during this encounter.   Yves Herb, MD

## 2023-11-27 ENCOUNTER — Encounter (HOSPITAL_COMMUNITY): Payer: Self-pay | Admitting: Psychiatry

## 2023-11-27 ENCOUNTER — Telehealth (HOSPITAL_COMMUNITY): Admitting: Psychiatry

## 2023-11-27 DIAGNOSIS — F411 Generalized anxiety disorder: Secondary | ICD-10-CM | POA: Diagnosis not present

## 2023-11-27 DIAGNOSIS — F325 Major depressive disorder, single episode, in full remission: Secondary | ICD-10-CM

## 2023-11-27 MED ORDER — PROPRANOLOL HCL 10 MG PO TABS
5.0000 mg | ORAL_TABLET | Freq: Two times a day (BID) | ORAL | 0 refills | Status: AC | PRN
Start: 2023-11-27 — End: ?

## 2023-11-27 MED ORDER — ARIPIPRAZOLE 2 MG PO TABS
2.0000 mg | ORAL_TABLET | Freq: Every day | ORAL | 0 refills | Status: AC
Start: 2023-11-27 — End: ?

## 2023-11-27 MED ORDER — ESCITALOPRAM OXALATE 20 MG PO TABS
20.0000 mg | ORAL_TABLET | Freq: Every day | ORAL | 0 refills | Status: DC
Start: 2023-11-27 — End: 2024-01-06

## 2023-11-28 ENCOUNTER — Encounter (HOSPITAL_COMMUNITY): Payer: Self-pay

## 2023-12-11 DIAGNOSIS — L578 Other skin changes due to chronic exposure to nonionizing radiation: Secondary | ICD-10-CM | POA: Diagnosis not present

## 2023-12-11 DIAGNOSIS — D225 Melanocytic nevi of trunk: Secondary | ICD-10-CM | POA: Diagnosis not present

## 2023-12-11 DIAGNOSIS — D485 Neoplasm of uncertain behavior of skin: Secondary | ICD-10-CM | POA: Diagnosis not present

## 2024-01-02 DIAGNOSIS — Z30431 Encounter for routine checking of intrauterine contraceptive device: Secondary | ICD-10-CM | POA: Diagnosis not present

## 2024-01-02 DIAGNOSIS — N939 Abnormal uterine and vaginal bleeding, unspecified: Secondary | ICD-10-CM | POA: Diagnosis not present

## 2024-01-05 NOTE — Progress Notes (Unsigned)
 BH MD/PA/NP OP Progress Note  01/05/2024 9:41 AM Briana Mccoy  MRN:  [id number]  Visit Diagnosis:  No diagnosis found.   Assessment: Briana Mccoy is a 18 y.o. female with a history of GAD and MDD who presented to [facility] at [facility] for initial evaluation on 12/09/2022 after transfer from Dr. Luvenia Salvage.    At initial evaluation patient reported symptoms of anxiety including excessive worry, ruminations, restlessness, increased irritability, negative thought patterns, skin/hair picking behaviors, and nausea.  Patient can experience increased physical symptoms when exposed to stressors including phone calls, phobias (bird/foods), or social interactions.  She also endorsed some degree of passive behaviors though denied associated compulsions.  In the past patient's anxiety could progress to depressive symptoms including low mood, anhedonia, amotivation, fatigue, and hypersomnia.  She denied any SI or thoughts of self-harm.  Since starting medications anxiety symptoms have become more tolerable and episodes of depression have become more intermittent and less severe.  Patient met criteria for generalized anxiety disorder with a strong social component as well as MDD in partial remission.  She also had some traits of OCD though not enough to meet criteria.  Briana Mccoy presents for follow-up evaluation. Today, 01/05/24, patient reports    significant increase in anxiety in the interim.  She reports excessive worry, some increased difficulty with sleep, feelings of being overwhelmed, restlessness, palpitations, and chest pressure.  Notably symptoms are constant throughout the day.  Patient continues to take Lexapro  consistently and believes was helping for the depression but less so for the anxiety.  She has not tried the propranolol  due to the anxiety being persistent and as she dislikes as needed medication.  Had discussed the possibility of trying a long-acting propranolol   however her would need patient to take the immediate release a couple times first to ensure no adverse side effects.  Instead would be appropriate to start low-dose Abilify  for adjunct in anxiety and mood symptoms.  Risks and benefits of this were explained.  We will follow up in a month.  Patient has also connected with a therapist at school it has been beneficial.  Psychotherapeutic interventions were used during today's session. From 3:02-3:18 PM. Therapeutic interventions included empathic listening, supportive therapy, cognitive and behavioral therapy, motivational interviewing. Used supportive interviewing techniques to provide emotional validation. Worked on cognitive reframing techniques and recommendations made for behavioral activation.  Improvement was evidenced by patient's participation and identified commitment to therapy goals  Plan: - Continue Lexapro  20 mg every day - Start Abilify  2 mg daily  -We will need to get updated metabolic blood work - Continue propranolol  5 mg BID prn for anxiety (if patient tries we could consider changing to long-acting formulary in the future) - Discontinue Atarax  (oversedation) - Continue therapy with school therapist - Crisis resources reviewed - Follow up in a month   Chief Complaint:  No chief complaint on file.  HPI: Briana Mccoy presents reporting that     she is struggling with an increase in anxiety symptoms.  She describes feeling overwhelmed and anxious all the time.  Up until a few days ago, when patient took a leave from work, she reports that her days were spent entirely between [employer] and work.  She had no time to take for herself or focus on self-care.  Patient did ask with a therapist a few weeks ago and at the recommendation took a leave from work in the last few days.  She notes that her anxiety symptoms have been increasing  as she approaches finals I will reschedule for next Thursday and she is hopeful that the anxiety  symptoms will improve after that.  Patient will be standing at [employer] area of the summer where she can continue to work and take 1 asynchronous summer course.  Briana Mccoy does feel like the depression is well controlled on Lexapro  though is not feeling that way about the anxiety.  In regards to medication still is taking the Lexapro  consistently but has not tried the as needed propranolol .  She reports that this is due to disliking taking as needed medications and because she is anxious all the time.  As for the hydroxyzine  she discontinued it because it was making her over sedated the next day.  Given this we recommended starting the patient on a daily medication to better manage anxiety symptoms.  Had considered BuSpar  however patient reports adverse side effects on it in the past.  Since ruminations are such a major concern we suggested starting low-dose Abilify  as an adjunct for mood and anxiety symptoms.  We had also discussed the possibility of a long-acting propranolol  we would need patient to take the immediate release version first to confirm there is no adverse side effects.  Given that there has been hesitancy to take the as needed medication Abilify  was determined to be the best option at this time.  Past Psychiatric History: Patient denies prior psychiatric hospitalizations or past suicide attempts. She connected with Dr. Luvenia Salvage in 2021 for psychiatric care and followed with her until she retired. She sees Nurse, mental health for therapy.   Patient has tried bupropion  (dissociates), venlafaxine , fluoxetine  (weight loss), and BuSpar .  Patient reports occasional marijuana use.  Risk of marijuana were discussed including adverse effects on anxiety.  She denies any other substance use.   Past Medical History:  Past Medical History:  Diagnosis Date   Allergy    Anxiety    Asthma     Past Surgical History:  Procedure Laterality Date   BREAST BIOPSY Right 03/06/2023   US  RT BREAST BX W LOC DEV  EA ADD LESION IMG BX SPEC US  GUIDE 03/06/2023 [facility]   BREAST BIOPSY Right 03/06/2023   US  RT BREAST BX W LOC DEV 1ST LESION IMG BX SPEC US  GUIDE 03/06/2023 [facility]   NASAL SEPTOPLASTY W/ TURBINOPLASTY Bilateral 02/25/2022   Procedure: NASAL SEPTOPLASTY WITH TURBINATE REDUCTION;  Surgeon: Reynold Caves, MD;  Location: [facility];  Service: ENT;  Laterality: Bilateral;   WISDOM TOOTH EXTRACTION  2021   Family History: No family history on file.  Social History:  Social History   Socioeconomic History   Marital status: Single    Spouse name: Not on file   Number of children: Not on file   Years of education: Not on file   Highest education level: Not on file  Occupational History   Not on file  Tobacco Use   Smoking status: Never   Smokeless tobacco: Not on file  Vaping Use   Vaping status: Former   Devices: last use july  Substance and Sexual Activity   Alcohol use: Never   Drug use: Never   Sexual activity: Not on file  Other Topics Concern   Not on file  Social History Narrative   Not on file   Social Drivers of Health   Financial Resource Strain: Not on file  Food Insecurity: Not on file  Transportation Needs: Not on file  Physical Activity: Not on file  Stress: Not on  file  Social Connections: Not on file    Allergies:  Allergies  Allergen Reactions   Tree Extract Other (See Comments)    TREE NUTS    Current Medications: Current Outpatient Medications  Medication Sig Dispense Refill   ARIPiprazole  (ABILIFY ) 2 MG tablet Take 1 tablet (2 mg total) by mouth daily. 90 tablet 0   cetirizine (ZYRTEC) 10 MG tablet Take 10 mg by mouth daily.     escitalopram  (LEXAPRO ) 20 MG tablet Take 1 tablet (20 mg total) by mouth daily. 90 tablet 0   FLOVENT HFA 44 MCG/ACT inhaler Inhale 2 puffs into the lungs 2 (two) times daily.     meloxicam  (MOBIC ) 15 MG tablet Take 1 tablet (15 mg total) by mouth daily. 30 tablet 0   propranolol  (INDERAL ) 10 MG  tablet Take 0.5 tablets (5 mg total) by mouth 2 (two) times daily as needed (anxiety). 60 tablet 0   No current facility-administered medications for this visit.     Psychiatric Specialty Exam: Review of Systems  There were no vitals taken for this visit.There is no height or weight on file to calculate BMI.  General Appearance: Fairly Groomed  Eye Contact:  Good  Speech:  Clear and Coherent  Volume:  Normal  Mood:  Anxious and Depressed  Affect:  Congruent  Thought Process:  Coherent  Orientation:  Full (Time, Place, and Person)  Thought Content: Logical   Suicidal Thoughts:  No  Homicidal Thoughts:  No  Memory:  Immediate;   Good  Judgement:  Fair  Insight:  Fair  Psychomotor Activity:  Normal  Concentration:  Concentration: Good  Recall:  Good  Fund of Knowledge: Fair  Language: Good  Akathisia:  NA    AIMS (if indicated): not done  Assets:  Communication Skills Desire for Improvement Housing Talents/Skills Transportation Vocational/Educational  ADL's:  Intact  Cognition: WNL  Sleep:  Good   Metabolic Disorder Labs: No results found for: "HGBA1C", "MPG" No results found for: "PROLACTIN" No results found for: "CHOL", "TRIG", "HDL", "CHOLHDL", "VLDL", "LDLCALC" No results found for: "TSH"  Therapeutic Level Labs: No results found for: "LITHIUM" No results found for: "VALPROATE" No results found for: "CBMZ"   Screenings: GAD-7    Flowsheet Row [facility]  Total GAD-7 Score 16      PHQ2-9    Flowsheet Row Office Visit from 12/09/2022 [facility] Counselor from 04/24/2022 in [facility] at [facility] Office Visit from 01/10/2021 in [facility] at [facility] Video Visit from 09/08/2020 in [facility] at [facility]  PHQ-2 Total  Score 1 0 4 5  PHQ-9 Total Score -- -- 13 18      Flowsheet Row Office Visit from 12/09/2022 in [facility] Counselor from 04/24/2022 in [facility] at [facility] Admission (Discharged) from 02/25/2022 in [facility]  C-SSRS RISK CATEGORY No Risk No Risk No Risk       Collaboration of Care: Collaboration of Care: Medication Management AEB medication prescription and Other provider involved in patient's care AEB OB and urgent care chart review  Patient/Guardian was advised Release of Information must be obtained prior to any record release in order to collaborate their care with an outside provider. Patient/Guardian was advised if they have not already done so to contact the registration department to sign all necessary forms in order for  us  to release information regarding their care.   Consent: Patient/Guardian gives verbal consent for treatment and assignment of benefits for services provided during this visit. Patient/Guardian expressed understanding and agreed to proceed.    Yves Herb, MD 01/05/2024, 9:41 AM   Virtual Visit via Video Note  I connected with Briana Mccoy on 01/05/24 at  4:30 PM EDT by a video enabled telemedicine application and verified that I am speaking with the correct person using two identifiers.  Location: Patient: School Provider: Home Office   I discussed the limitations of evaluation and management by telemedicine and the availability of in person appointments. The patient expressed understanding and agreed to proceed.   I discussed the assessment and treatment plan with the patient. The patient was provided an opportunity to ask questions and all were answered. The patient agreed with the plan and demonstrated an understanding of the instructions.   The patient was advised to call back or seek an in-person evaluation if the symptoms worsen or if the condition fails to  improve as anticipated.  I provided 20 minutes of non-face-to-face time during this encounter.   Yves Herb, MD

## 2024-01-06 ENCOUNTER — Telehealth (HOSPITAL_BASED_OUTPATIENT_CLINIC_OR_DEPARTMENT_OTHER): Admitting: Psychiatry

## 2024-01-06 DIAGNOSIS — F411 Generalized anxiety disorder: Secondary | ICD-10-CM

## 2024-01-06 DIAGNOSIS — F331 Major depressive disorder, recurrent, moderate: Secondary | ICD-10-CM | POA: Diagnosis not present

## 2024-01-06 DIAGNOSIS — F325 Major depressive disorder, single episode, in full remission: Secondary | ICD-10-CM

## 2024-01-06 MED ORDER — CARIPRAZINE HCL 1.5 MG PO CAPS
1.5000 mg | ORAL_CAPSULE | Freq: Every day | ORAL | 2 refills | Status: AC
Start: 2024-01-06 — End: ?

## 2024-01-06 MED ORDER — ESCITALOPRAM OXALATE 20 MG PO TABS
20.0000 mg | ORAL_TABLET | Freq: Every day | ORAL | 0 refills | Status: DC
Start: 2024-01-06 — End: 2024-02-27

## 2024-01-07 ENCOUNTER — Encounter (HOSPITAL_COMMUNITY): Payer: Self-pay | Admitting: Psychiatry

## 2024-02-20 DIAGNOSIS — L739 Follicular disorder, unspecified: Secondary | ICD-10-CM | POA: Diagnosis not present

## 2024-02-20 DIAGNOSIS — L700 Acne vulgaris: Secondary | ICD-10-CM | POA: Diagnosis not present

## 2024-02-20 DIAGNOSIS — L723 Sebaceous cyst: Secondary | ICD-10-CM | POA: Diagnosis not present

## 2024-02-20 DIAGNOSIS — L74510 Primary focal hyperhidrosis, axilla: Secondary | ICD-10-CM | POA: Diagnosis not present

## 2024-02-25 ENCOUNTER — Other Ambulatory Visit (HOSPITAL_COMMUNITY): Payer: Self-pay | Admitting: Psychiatry

## 2024-02-25 DIAGNOSIS — F331 Major depressive disorder, recurrent, moderate: Secondary | ICD-10-CM

## 2024-02-25 DIAGNOSIS — F411 Generalized anxiety disorder: Secondary | ICD-10-CM

## 2024-02-26 ENCOUNTER — Other Ambulatory Visit (HOSPITAL_COMMUNITY): Payer: Self-pay | Admitting: Psychiatry

## 2024-02-26 DIAGNOSIS — F411 Generalized anxiety disorder: Secondary | ICD-10-CM

## 2024-02-26 DIAGNOSIS — F331 Major depressive disorder, recurrent, moderate: Secondary | ICD-10-CM

## 2024-03-01 NOTE — Progress Notes (Unsigned)
 BH MD/PA/NP OP Progress Note  03/01/2024 10:50 AM Briana Mccoy  MRN:  [id number]  Visit Diagnosis:  No diagnosis found.  Assessment: Briana Mccoy is a 18 y.o. female with a history of GAD and MDD who presented to [facility] at [facility] for initial evaluation on 12/09/2022 after transfer from Dr. Philis.    At initial evaluation patient reported symptoms of anxiety including excessive worry, ruminations, restlessness, increased irritability, negative thought patterns, skin/hair picking behaviors, and nausea.  Patient can experience increased physical symptoms when exposed to stressors including phone calls, phobias (bird/foods), or social interactions.  She also endorsed some degree of passive behaviors though denied associated compulsions.  In the past patient's anxiety could progress to depressive symptoms including low mood, anhedonia, amotivation, fatigue, and hypersomnia.  She denied any SI or thoughts of self-harm.  Since starting medications anxiety symptoms have become more tolerable and episodes of depression have become more intermittent and less severe.  Patient met criteria for generalized anxiety disorder with a strong social component as well as MDD in partial remission.  She also had some traits of OCD though not enough to meet criteria.  Briana Mccoy presents for follow-up evaluation. Today, 03/01/24, patient reports that    anxiety symptoms have improved with the completion of the school stressors.  That said she does still experience anxiety related to medication changes.  Patient did not start the Abilify  in the interim due to this concern.  Her depression symptoms are more notable today possibly because the anxiety symptoms have been asking them in the past.  Patient endorsed feelings of anhedonia, worthlessness, negative self thoughts, and isolation.  She denied any SI or thoughts of self-harm.  Given ongoing depressive symptoms we still think is  appropriate to add an adjunct medication to her Lexapro .  As the metabolic side effects of Abilify  have concern her we will start Vraylar  instead due to its slightly better metabolic profile.  Patient will follow up in 6 weeks.  Plan: - Continue Lexapro  20 mg every day -Start Vraylar  1.5 mg daily - Discontinue Abilify  2 mg daily (patient did not try due to concern of adverse side effects)  -We will need to get updated metabolic blood work - Continue propranolol  5 mg BID prn for anxiety (could consider changing to long-acting formulary in the future) - Discontinue Atarax  (oversedation) - Continue therapy with school therapist - Crisis resources reviewed - Follow up in 6 weeks   Chief Complaint:  No chief complaint on file.  HPI: Briana Mccoy presents    from her car when she is returning from vacation with her friend.  She reports that her anxiety symptoms have improved ever since the end of her school semester.  Since she completed her finals she feels like the anxiety decreased and she opted not to take the Abilify .  Patient listed concerned about adverse side effects to this medication.  On that front patient does still appear to have some significant anxiety related to medication.  There has been some minor improvement though as she did try propranolol  once in the past couple weeks.  She tried the 5 mg dose when she is feeling anxious 1 night and found it to be beneficial.  She denied any negatives from the medication.  Patient's depressive symptoms are more significant currently.  These are longer standing though often masked by the anxiety symptoms.  Currently patient reports feeling isolated and lonely.  She is staying at school over the summer to work the  2 jobs through the school.  While her friends also stayed on campus she works a different schedule than them and does not get the job between interact with them often.  Furthermore she has not had much opportunity to get home and see her  family.  Patient did just go on vacation with them for the past week which was fine but is uncertain if she is going to see them again during the summer.  In addition to the isolation patient also endorsed negative self thoughts and feelings of worthlessness.  She denied any SI or thoughts of self-harm.  Given her ongoing depressive symptoms we did suggest adjunct medication again today.  Since patient has significant sermon about potential weight gain from Abilify  and we we will start Vraylar  for adjunct therapy instead.  This is pending insurance coverage.  If insurance does not approve medications she is open to trialing Abilify  first and then going back to Vraylar  if it fails.  Past Psychiatric History: Patient denies prior psychiatric hospitalizations or past suicide attempts. She connected with Dr. Philis in 2021 for psychiatric care and followed with her until she retired. She sees Nurse, mental health for therapy.   Patient has tried bupropion  (dissociates), venlafaxine , fluoxetine  (weight loss), and BuSpar .  Patient reports occasional marijuana use.  Risk of marijuana were discussed including adverse effects on anxiety.  She denies any other substance use.   Past Medical History:  Past Medical History:  Diagnosis Date   Allergy    Anxiety    Asthma     Past Surgical History:  Procedure Laterality Date   BREAST BIOPSY Right 03/06/2023   US  RT BREAST BX W LOC DEV EA ADD LESION IMG BX SPEC US  GUIDE 03/06/2023 [facility]   BREAST BIOPSY Right 03/06/2023   US  RT BREAST BX W LOC DEV 1ST LESION IMG BX SPEC US  GUIDE 03/06/2023 [facility]   NASAL SEPTOPLASTY W/ TURBINOPLASTY Bilateral 02/25/2022   Procedure: NASAL SEPTOPLASTY WITH TURBINATE REDUCTION;  Surgeon: Karis Clunes, MD;  Location: [facility];  Service: ENT;  Laterality: Bilateral;   WISDOM TOOTH EXTRACTION  2021   Family History: No family history on file.  Social History:  Social History   Socioeconomic History    Marital status: Single    Spouse name: Not on file   Number of children: Not on file   Years of education: Not on file   Highest education level: Not on file  Occupational History   Not on file  Tobacco Use   Smoking status: Never   Smokeless tobacco: Not on file  Vaping Use   Vaping status: Former   Devices: last use [place]  Substance and Sexual Activity   Alcohol use: Never   Drug use: Never   Sexual activity: Not on file  Other Topics Concern   Not on file  Social History Narrative   Not on file   Social Drivers of Health   Financial Resource Strain: Not on file  Food Insecurity: Not on file  Transportation Needs: Not on file  Physical Activity: Not on file  Stress: Not on file  Social Connections: Not on file    Allergies:  Allergies  Allergen Reactions   Tree Extract Other (See Comments)    TREE NUTS    Current Medications: Current Outpatient Medications  Medication Sig Dispense Refill   ARIPiprazole  (ABILIFY ) 2 MG tablet Take 1 tablet (2 mg total) by mouth daily. 90 tablet 0   cariprazine  (VRAYLAR ) 1.5 MG  capsule Take 1 capsule (1.5 mg total) by mouth daily. 30 capsule 2   cetirizine (ZYRTEC) 10 MG tablet Take 10 mg by mouth daily.     escitalopram  (LEXAPRO ) 20 MG tablet TAKE 1 TABLET(20 MG) BY MOUTH DAILY 30 tablet 0   FLOVENT HFA 44 MCG/ACT inhaler Inhale 2 puffs into the lungs 2 (two) times daily.     meloxicam  (MOBIC ) 15 MG tablet Take 1 tablet (15 mg total) by mouth daily. 30 tablet 0   propranolol  (INDERAL ) 10 MG tablet Take 0.5 tablets (5 mg total) by mouth 2 (two) times daily as needed (anxiety). 60 tablet 0   No current facility-administered medications for this visit.     Psychiatric Specialty Exam: Review of Systems  There were no vitals taken for this visit.There is no height or weight on file to calculate BMI.  General Appearance: Fairly Groomed  Eye Contact:  Good  Speech:  Clear and Coherent  Volume:  Normal  Mood:  Anxious and  Depressed  Affect:  Congruent  Thought Process:  Coherent  Orientation:  Full (Time, Place, and Person)  Thought Content: Logical   Suicidal Thoughts:  No  Homicidal Thoughts:  No  Memory:  Immediate;   Good  Judgement:  Fair  Insight:  Fair  Psychomotor Activity:  Normal  Concentration:  Concentration: Good  Recall:  Good  Fund of Knowledge: Fair  Language: Good  Akathisia:  NA    AIMS (if indicated): not done  Assets:  Communication Skills Desire for Improvement Housing Talents/Skills Transportation Vocational/Educational  ADL's:  Intact  Cognition: WNL  Sleep:  Good   Metabolic Disorder Labs: No results found for: HGBA1C, MPG No results found for: PROLACTIN No results found for: CHOL, TRIG, HDL, CHOLHDL, VLDL, LDLCALC No results found for: TSH  Therapeutic Level Labs: No results found for: LITHIUM No results found for: VALPROATE No results found for: CBMZ   Screenings: GAD-7    Flowsheet Row Office Visit from 12/09/2022 [facility]  Total GAD-7 Score 16   PHQ2-9    Flowsheet Row Office Visit from 12/09/2022 in [facility] Counselor from 04/24/2022 in [facility] Office Visit from 01/10/2021 in [facility] at [facility] Video Visit from 09/08/2020 in [facility] at [facility]  PHQ-2 Total Score 1 0 4 5  PHQ-9 Total Score -- -- 13 18   Flowsheet Row Office Visit from 12/09/2022 in [facility] Counselor from 04/24/2022 in [facility] at [facility] Admission (Discharged) from 02/25/2022 in [facility]  C-SSRS RISK CATEGORY No Risk No Risk No Risk    Collaboration of Care: Collaboration of Care: Medication Management AEB medication prescription and  Other provider involved in patient's care AEB OB and urgent care chart review  Patient/Guardian was advised Release of Information must be obtained prior to any record release in order to collaborate their care with an outside provider. Patient/Guardian was advised if they have not already done so to contact the registration department to sign all necessary forms in order for us  to release information regarding their care.   Consent: Patient/Guardian gives verbal consent for treatment and assignment of benefits for services provided during this visit. Patient/Guardian expressed understanding and agreed to proceed.    Briana CHRISTELLA Finder, MD 03/01/2024, 10:50 AM   Virtual Visit via Video Note  I connected with Briana Mccoy on 03/01/24  at 11:00 AM EDT by a video enabled telemedicine application and verified that I am speaking with the correct person using two identifiers.  Location: Patient: School Provider: Home Office   I discussed the limitations of evaluation and management by telemedicine and the availability of in person appointments. The patient expressed understanding and agreed to proceed.   I discussed the assessment and treatment plan with the patient. The patient was provided an opportunity to ask questions and all were answered. The patient agreed with the plan and demonstrated an understanding of the instructions.   The patient was advised to call back or seek an in-person evaluation if the symptoms worsen or if the condition fails to improve as anticipated.  I provided 20 minutes of non-face-to-face time during this encounter.   Briana CHRISTELLA Finder, MD

## 2024-03-02 ENCOUNTER — Encounter (HOSPITAL_COMMUNITY): Payer: Self-pay

## 2024-03-02 ENCOUNTER — Encounter (HOSPITAL_COMMUNITY): Admitting: Psychiatry

## 2024-03-02 NOTE — Progress Notes (Signed)
 This encounter was created in error - please disregard.  Patient did not show up for the appointment.  Appointment reminders were sent to patient's phone and email.  Attempted to call the patient at 11:06 but received no response. Left a HIPAA compliant voicemail for patient to reschedule.

## 2024-04-21 DIAGNOSIS — R300 Dysuria: Secondary | ICD-10-CM | POA: Diagnosis not present

## 2024-04-21 DIAGNOSIS — N390 Urinary tract infection, site not specified: Secondary | ICD-10-CM | POA: Diagnosis not present

## 2024-05-12 DIAGNOSIS — M546 Pain in thoracic spine: Secondary | ICD-10-CM | POA: Diagnosis not present

## 2024-05-12 DIAGNOSIS — R10A2 Flank pain, left side: Secondary | ICD-10-CM | POA: Diagnosis not present

## 2024-05-12 DIAGNOSIS — N12 Tubulo-interstitial nephritis, not specified as acute or chronic: Secondary | ICD-10-CM | POA: Diagnosis not present

## 2024-05-12 DIAGNOSIS — R110 Nausea: Secondary | ICD-10-CM | POA: Diagnosis not present

## 2024-05-31 ENCOUNTER — Encounter: Payer: Self-pay | Admitting: Radiology

## 2024-06-06 DIAGNOSIS — M545 Low back pain, unspecified: Secondary | ICD-10-CM | POA: Diagnosis not present

## 2024-07-28 ENCOUNTER — Other Ambulatory Visit (HOSPITAL_COMMUNITY): Payer: Self-pay | Admitting: Psychiatry

## 2024-07-28 DIAGNOSIS — F331 Major depressive disorder, recurrent, moderate: Secondary | ICD-10-CM

## 2024-07-28 DIAGNOSIS — F411 Generalized anxiety disorder: Secondary | ICD-10-CM
